# Patient Record
Sex: Male | Born: 1995 | Race: Black or African American | Hispanic: No
Health system: Southern US, Community
[De-identification: ages and names within clinical notes are randomized; demographics above are authoritative.]

---
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
## 2019-05-19 ENCOUNTER — Emergency Department (HOSPITAL_COMMUNITY)
Admission: EM | Admit: 2019-05-19 | Discharge: 2019-05-19 | Disposition: A | Discharge status: Home / Self Care | Payer: No Typology Code available for payment source | Attending: Emergency Medicine | Admitting: Emergency Medicine

## 2019-05-19 ENCOUNTER — Emergency Department (HOSPITAL_COMMUNITY): Payer: No Typology Code available for payment source

## 2019-05-19 ENCOUNTER — Other Ambulatory Visit: Payer: Self-pay

## 2019-05-19 ENCOUNTER — Encounter (HOSPITAL_COMMUNITY): Payer: Self-pay | Admitting: Emergency Medicine

## 2019-05-19 DIAGNOSIS — Z113 Encounter for screening for infections with a predominantly sexual mode of transmission: Secondary | ICD-10-CM

## 2019-05-19 DIAGNOSIS — R1032 Left lower quadrant pain: Secondary | ICD-10-CM | POA: Insufficient documentation

## 2019-05-19 DIAGNOSIS — Z202 Contact with and (suspected) exposure to infections with a predominantly sexual mode of transmission: Secondary | ICD-10-CM | POA: Insufficient documentation

## 2019-05-19 LAB — URINALYSIS, ROUTINE W REFLEX MICROSCOPIC
Bilirubin Urine: NEGATIVE
Glucose, UA: NEGATIVE mg/dL
Hgb urine dipstick: NEGATIVE
Ketones, ur: NEGATIVE mg/dL
Leukocytes,Ua: NEGATIVE
Nitrite: NEGATIVE
Protein, ur: NEGATIVE mg/dL
Specific Gravity, Urine: 1.023 (ref 1.005–1.030)
pH: 6 (ref 5.0–8.0)

## 2019-05-19 MED ORDER — METHOCARBAMOL 500 MG PO TABS: 500.0000 mg | ORAL_TABLET | Freq: Two times a day (BID) | ORAL | 0 refills | Status: AC

## 2019-05-19 MED ORDER — ALUM & MAG HYDROXIDE-SIMETH 200-200-20 MG/5ML PO SUSP
30.0000 mL | Freq: Once | ORAL | Status: AC
  Administered 2019-05-19: 30 mL via ORAL
  Filled 2019-05-19: qty 30

## 2019-05-19 MED ORDER — DICLOFENAC SODIUM 1 % TD GEL: 4.0000 g | Freq: Four times a day (QID) | TRANSDERMAL | 0 refills | Status: AC

## 2019-05-19 MED ORDER — METHOCARBAMOL 500 MG PO TABS
1000.0000 mg | ORAL_TABLET | ORAL | Status: AC
  Administered 2019-05-19: 1000 mg via ORAL
  Filled 2019-05-19: qty 2

## 2019-05-19 MED ORDER — ACETAMINOPHEN 500 MG PO TABS
1000.0000 mg | ORAL_TABLET | Freq: Once | ORAL | Status: AC
  Administered 2019-05-19: 1000 mg via ORAL
  Filled 2019-05-19: qty 2

## 2019-05-19 NOTE — ED Provider Notes (Signed)
[facility] EMERGENCY DEPARTMENT Provider Note   CSN: [id number] Arrival date & time: 05/19/19  0419    History   Chief Complaint Chief Complaint  Patient presents with  . Groin Pain    HPI Carl Weeks is a 23 y.o. male.     The history is provided by the patient.  Groin Pain  This is a chronic problem. The current episode started more than 1 week ago. The problem occurs constantly. The problem has not changed since onset.Pertinent negatives include no chest pain, no abdominal pain, no headaches and no shortness of breath. Nothing aggravates the symptoms. Nothing relieves the symptoms. He has tried nothing for the symptoms. The treatment provided no relief.  Slipped in the shower on the 11th, and has a groin strain.  Still not better seen urgent care x 2.  Also wants STD testing.    History reviewed. No pertinent past medical history.  There are no active problems to display for this patient.   History reviewed. No pertinent surgical history.      Home Medications    Prior to Admission medications   Not on File    Family History No family history on file.  Social History Social History   Tobacco Use  . Smoking status: Never Smoker  . Smokeless tobacco: Never Used  Substance Use Topics  . Alcohol use: Not Currently  . Drug use: Yes    Types: Marijuana     Allergies   Patient has no allergy information on record.   Review of Systems Review of Systems  Respiratory: Negative for shortness of breath.   Cardiovascular: Negative for chest pain.  Gastrointestinal: Negative for abdominal pain.  Neurological: Negative for headaches.     Physical Exam Updated Vital Signs BP (!) 143/109   Pulse 87   Temp 97.8 F (36.6 C) (Oral)   Resp 18   Wt 76.2 kg   SpO2 99%   Physical Exam Vitals signs and nursing note reviewed.  Constitutional:      Appearance: He is normal weight.  HENT:     Head: Normocephalic and atraumatic.   Nose: Nose normal.  Eyes:     Conjunctiva/sclera: Conjunctivae normal.     Pupils: Pupils are equal, round, and reactive to light.  Neck:     Musculoskeletal: Normal range of motion and neck supple.  Cardiovascular:     Rate and Rhythm: Normal rate and regular rhythm.     Pulses: Normal pulses.     Heart sounds: Normal heart sounds.  Pulmonary:     Effort: Pulmonary effort is normal.     Breath sounds: Normal breath sounds.  Abdominal:     General: Abdomen is flat. Bowel sounds are normal.     Tenderness: There is no abdominal tenderness. There is no guarding or rebound.     Hernia: No hernia is present.  Musculoskeletal: Normal range of motion.        General: No tenderness or deformity.     Left hip: Normal.     Left knee: Normal.     Right lower leg: No edema.     Left lower leg: No edema.  Skin:    General: Skin is warm and dry.  Neurological:     General: No focal deficit present.     Mental Status: He is alert and oriented to person, place, and time.  Psychiatric:        Mood and Affect: Mood normal.  ED Treatments / Results  Labs (all labs ordered are listed, but only abnormal results are displayed) Labs Reviewed  URINALYSIS, ROUTINE W REFLEX MICROSCOPIC - Abnormal; Notable for the following components:      Result Value   APPearance HAZY (*)    All other components within normal limits    EKG None  Radiology Dg Hips Bilat W Or Wo Pelvis 3-4 Views  Result Date: 05/19/2019 CLINICAL DATA:  Bilateral hip pain EXAM: DG HIP (WITH OR WITHOUT PELVIS) 3-4V BILAT COMPARISON:  None. FINDINGS: No fracture or dislocation is seen. Bilateral hip joint spaces are preserved. Visualized bony pelvis appears intact. IMPRESSION: Negative. Electronically Signed   By: Charline BillsSriyesh  Krishnan M.D.   On: 05/19/2019 06:45    Procedures Procedures (including critical care time)  Medications Ordered in ED Medications  alum & mag hydroxide-simeth (MAALOX/MYLANTA) 200-200-20 MG/5ML  suspension 30 mL (30 mLs Oral Given 05/19/19 0646)  acetaminophen (TYLENOL) tablet 1,000 mg (1,000 mg Oral Given 05/19/19 0647)  methocarbamol (ROBAXIN) tablet 1,000 mg (1,000 mg Oral Given 05/19/19 0646)    Requested GC testing added to the urine,  Will treat with robaxin and voltaren gel    Final Clinical Impressions(s) / ED Diagnoses   Return for intractable cough, coughing up blood,fevers >100.4 unrelieved by medication, shortness of breath, intractable vomiting, chest pain, shortness of breath, weakness,numbness, changes in speech, facial asymmetry,abdominal pain, passing out,Inability to tolerate liquids or food, cough, altered mental status or any concerns. No signs of systemic illness or infection. The patient is nontoxic-appearing on exam and vital signs are within normal limits.   I have reviewed the triage vital signs and the nursing notes. Pertinent labs &imaging results that were available during my care of the patient were reviewed by me and considered in my medical decision making (see chart for details).  After history, exam, and medical workup I feel the patient has been appropriately medically screened and is safe for discharge home. Pertinent diagnoses were discussed with the patient. Patient was given return precautions   Jasemine Nawaz, MD 05/19/19 [id number]

## 2019-05-19 NOTE — ED Triage Notes (Signed)
Pt in with c/o L groin pain since straining a tendon while getting out of the shower 5/11. States he has limited leg lifting ability, is able to bear weight. Has been taking Naproxen, but now has stomach pain. Also wants STD check

## 2019-05-20 LAB — GC/CHLAMYDIA PROBE AMP (~~LOC~~) NOT AT ARMC
Chlamydia: NEGATIVE
Neisseria Gonorrhea: NEGATIVE

## 2019-05-21 ENCOUNTER — Telehealth (HOSPITAL_COMMUNITY): Payer: Self-pay

## 2019-06-07 ENCOUNTER — Emergency Department (HOSPITAL_COMMUNITY)
Admission: EM | Admit: 2019-06-07 | Discharge: 2019-06-07 | Disposition: A | Discharge status: Home / Self Care | Payer: No Typology Code available for payment source | Attending: Emergency Medicine | Admitting: Emergency Medicine

## 2019-06-07 ENCOUNTER — Other Ambulatory Visit: Payer: Self-pay

## 2019-06-07 ENCOUNTER — Encounter (HOSPITAL_COMMUNITY): Payer: Self-pay | Admitting: Emergency Medicine

## 2019-06-07 DIAGNOSIS — R1032 Left lower quadrant pain: Secondary | ICD-10-CM | POA: Insufficient documentation

## 2019-06-07 MED ORDER — LACTATED RINGERS IV BOLUS: 1000.0000 mL | Freq: Once | INTRAVENOUS | Status: DC

## 2019-06-07 MED ORDER — DIPHENHYDRAMINE HCL 50 MG/ML IJ SOLN: 25.0000 mg | Freq: Once | INTRAMUSCULAR | Status: DC

## 2019-06-07 NOTE — ED Provider Notes (Signed)
[place] EMERGENCY DEPARTMENT Provider Note   CSN: [id number] Arrival date & time: 06/07/19  0050     History   Chief Complaint Chief Complaint  Patient presents with   Groin Pain    HPI Carl Weeks is a 23 y.o. male.      Groin Pain This is a new problem. The current episode started more than 1 week ago. The problem occurs constantly. The problem has been gradually improving. Pertinent negatives include no chest pain, no abdominal pain, no headaches and no shortness of breath. Nothing aggravates the symptoms. Relieved by: improving with time. He has tried nothing for the symptoms.    History reviewed. No pertinent past medical history.  There are no active problems to display for this patient.   No past surgical history on file.      Home Medications    Prior to Admission medications   Medication Sig Start Date End Date Taking? Authorizing Provider  diclofenac sodium (VOLTAREN) 1 % GEL Apply 4 g topically 4 (four) times daily. 05/19/19   Palumbo, April, MD  methocarbamol (ROBAXIN) 500 MG tablet Take 1 tablet (500 mg total) by mouth 2 (two) times daily. 05/19/19   Palumbo, April, MD    Family History No family history on file.  Social History Social History   Tobacco Use   Smoking status: Never Smoker   Smokeless tobacco: Never Used  Substance Use Topics   Alcohol use: Not Currently   Drug use: Yes    Types: Marijuana     Allergies   Patient has no allergy information on record.   Review of Systems Review of Systems  Respiratory: Negative for shortness of breath.   Cardiovascular: Negative for chest pain.  Gastrointestinal: Negative for abdominal pain.  Neurological: Negative for headaches.  All other systems reviewed and are negative.    Physical Exam Updated Vital Signs BP (!) 108/52 (BP Location: Left Arm)    Pulse 68    Temp 98.5 F (36.9 C) (Oral)    Resp 16    Ht 6' (1.829 m)    Wt 76.2 kg    SpO2 99%    BMI  22.78 kg/m   Physical Exam Vitals signs and nursing note reviewed.  Constitutional:      Appearance: He is well-developed.  HENT:     Head: Normocephalic and atraumatic.     Nose: No congestion or rhinorrhea.     Mouth/Throat:     Mouth: Mucous membranes are moist.     Pharynx: Oropharynx is clear.  Eyes:     Pupils: Pupils are equal, round, and reactive to light.  Neck:     Musculoskeletal: Normal range of motion.  Cardiovascular:     Rate and Rhythm: Normal rate.  Pulmonary:     Effort: Pulmonary effort is normal. No respiratory distress.  Abdominal:     General: There is no distension.  Genitourinary:    Penis: Normal.      Scrotum/Testes: Normal.     Rectum: Normal.  Musculoskeletal: Normal range of motion.        General: Tenderness (left inguinal area with direct palpation and ROM) present.  Skin:    General: Skin is warm and dry.  Neurological:     General: No focal deficit present.     Mental Status: He is alert.      ED Treatments / Results  Labs (all labs ordered are listed, but only abnormal results are displayed) Labs Reviewed -  No data to display  EKG    Radiology No results found.  Procedures Procedures (including critical care time)  Medications Ordered in ED Medications - No data to display   Initial Impression / Assessment and Plan / ED Course  I have reviewed the triage vital signs and the nursing notes.  Pertinent labs & imaging results that were available during my care of the patient were reviewed by me and considered in my medical decision making (see chart for details).        Improving left inguinal sprain. No indication for further workup at this time. Ortho info given.   Final Clinical Impressions(s) / ED Diagnoses   Final diagnoses:  Left groin pain    ED Discharge Orders    None       Maybelle Depaoli, Barbara CowerJason, MD 06/07/19 [phone]

## 2019-06-07 NOTE — ED Triage Notes (Signed)
C/o left groin pain that started a  Month ago.  Reports he was seen when it happened and everything was ruled out.  States I want to make sure its okay because I shouldn't still be hurting.  Rating pain at 2/10.

## 2019-06-18 ENCOUNTER — Other Ambulatory Visit: Payer: Self-pay

## 2019-06-18 ENCOUNTER — Ambulatory Visit (INDEPENDENT_AMBULATORY_CARE_PROVIDER_SITE_OTHER): Payer: No Typology Code available for payment source | Admitting: Orthopaedic Surgery

## 2019-06-18 DIAGNOSIS — S76012A Strain of muscle, fascia and tendon of left hip, initial encounter: Secondary | ICD-10-CM

## 2019-06-18 NOTE — Progress Notes (Signed)
Office Visit Note   Patient: Carl Weeks           Date of Birth: 1996-06-26           MRN: [id number] Visit Date: 06/18/2019              Requested by: No referring provider defined for this encounter. PCP: Patient, No Pcp Per   Assessment & Plan: Visit Diagnoses:  1. Strain of left hip adductor muscle, initial encounter     Plan: Impression is strain of left hip adductor that has been improving.  I recommend outpatient physical therapy for strengthening and stretching.  Continue with symptomatic treatment such as rest and heat and NSAIDs.  Questions encouraged and answered.  Follow-up as needed.  Follow-Up Instructions: Return if symptoms worsen or fail to improve.   Orders:  No orders of the defined types were placed in this encounter.  No orders of the defined types were placed in this encounter.     Procedures: No procedures performed   Clinical Data: No additional findings.   Subjective: Chief Complaint  Patient presents with  . Left Leg - Pain    Carl Weeks is a 23 year old gentleman who comes in for continued left groin pain since he slipped in the shower in May.  He has since been evaluated multiple times in the ER for this and ruled out for STDs or testicular torsion.  He states overall his hip function is improved and that he is able to flex and extend his knee and hip.  He was originally unable to bear weight.  He has not noticed any bruising.  He feels pain overlying the abductor muscle area.  Denies any lateral hip pain.  Denies any numbness and tingling.   Review of Systems  Constitutional: Negative.   All other systems reviewed and are negative.    Objective: Vital Signs: There were no vitals taken for this visit.  Physical Exam Vitals signs and nursing note reviewed.  Constitutional:      Appearance: He is well-developed.  HENT:     Head: Normocephalic and atraumatic.  Eyes:     Pupils: Pupils are equal, round, and reactive to light.  Neck:      Musculoskeletal: Neck supple.  Pulmonary:     Effort: Pulmonary effort is normal.  Abdominal:     Palpations: Abdomen is soft.  Musculoskeletal: Normal range of motion.  Skin:    General: Skin is warm.  Neurological:     Mental Status: He is alert and oriented to person, place, and time.  Psychiatric:        Behavior: Behavior normal.        Thought Content: Thought content normal.        Judgment: Judgment normal.     Ortho Exam Left hip exam shows tenderness of the adductor insertion near the groin region.  He has pain with resisted hip adduction.  Otherwise hip exam is normal. Specialty Comments:  No specialty comments available.  Imaging: No results found.   PMFS History: There are no active problems to display for this patient.  No past medical history on file.  No family history on file.  No past surgical history on file. Social History   Occupational History  . Not on file  Tobacco Use  . Smoking status: Never Smoker  . Smokeless tobacco: Never Used  Substance and Sexual Activity  . Alcohol use: Not Currently  . Drug use: Yes    Types: Marijuana  .  Sexual activity: Not on file

## 2019-06-19 ENCOUNTER — Telehealth: Payer: Self-pay | Admitting: Orthopaedic Surgery

## 2019-06-19 DIAGNOSIS — S76012A Strain of muscle, fascia and tendon of left hip, initial encounter: Secondary | ICD-10-CM

## 2019-06-19 NOTE — Telephone Encounter (Signed)
Sure - that's fine

## 2019-06-19 NOTE — Telephone Encounter (Signed)
Please advise 

## 2019-06-19 NOTE — Telephone Encounter (Signed)
Pt called in requesting to have an mri done said he was told he tore his left groin and he wants to see that for himself in a mri imagine to confirm it.   [phone]

## 2019-06-19 NOTE — Telephone Encounter (Signed)
I had called patient to sched an appt, did not realize he already saw you.  Referral showed up in [place] today.   Patient is still asking for an MRI scan to confirm the groin strain.  Do you think any other imaging would show this?  He says this has been going on for 2 months, and he really wants the MRI done.   Please call him to discuss/advise.

## 2019-06-20 ENCOUNTER — Encounter: Payer: Self-pay | Admitting: Orthopaedic Surgery

## 2019-06-20 NOTE — Addendum Note (Signed)
Addended by: Brand Males E on: 06/20/2019 08:00 AM   Modules accepted: Orders

## 2019-06-20 NOTE — Progress Notes (Unsigned)
Patient is going for MRI scan 06/21/19 845pm, at [place].  Can you please review scan on [place] and call patient with the results?  Thanks.  [phone]

## 2019-06-20 NOTE — Telephone Encounter (Signed)
Order entered, will try to get scheduled ASAP for patient.

## 2019-06-20 NOTE — Progress Notes (Signed)
yes

## 2019-06-25 ENCOUNTER — Ambulatory Visit: Payer: No Typology Code available for payment source | Admitting: Orthopaedic Surgery

## 2019-06-25 NOTE — Telephone Encounter (Signed)
Please see below and advise.

## 2019-06-25 NOTE — Progress Notes (Signed)
Why is he not able to come in to see me?

## 2019-06-25 NOTE — Progress Notes (Signed)
Reminder to review and call patient with results, thanks.

## 2019-06-25 NOTE — Telephone Encounter (Signed)
MRI is completely normal.  If he has any additional questions, he's welcome to make an appointment.  Thanks.

## 2019-06-25 NOTE — Progress Notes (Signed)
He had just asked for a phone call.  I can certainly tell him you would rather see him to review so you can show him the images, etc.  Just let me know.

## 2019-06-25 NOTE — Progress Notes (Signed)
Spoke with him

## 2019-06-25 NOTE — Telephone Encounter (Signed)
Patient called in stating that he got the call his MRI results were back. I tried to schedule appt for MRI follow up patient doesn't want to come in for that he wants the results over the phone  please advise   [phone]

## 2019-06-26 NOTE — Telephone Encounter (Signed)
I called pt and advised of message below. Advised to call with any questions.

## 2020-08-11 ENCOUNTER — Ambulatory Visit: Payer: Self-pay

## 2021-04-23 IMAGING — CR DG HIP (WITH OR WITHOUT PELVIS) 3-4V BILAT
5 series · 5 of 5 positions shown · non-contrast
Comparison: None.

CLINICAL DATA: Bilateral hip pain

EXAM:
DG HIP (WITH OR WITHOUT PELVIS) 3-4V BILAT

[pelvis ap]
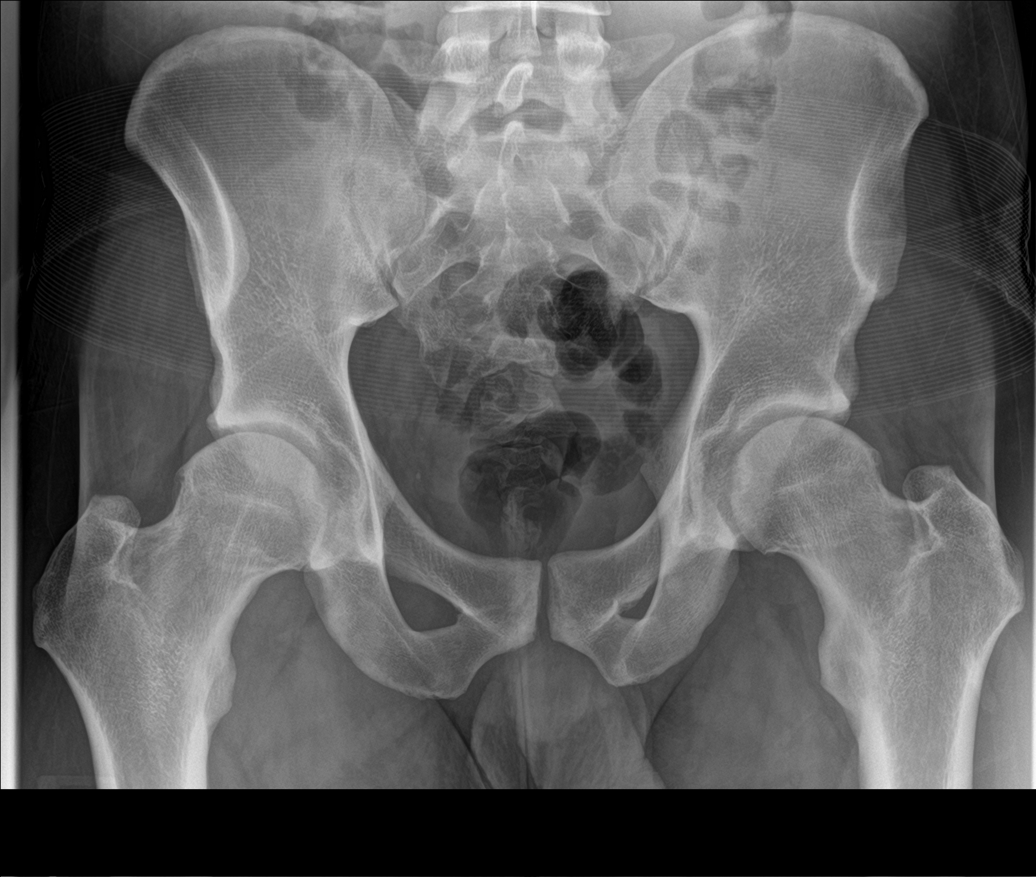

[hip ap (1 of 2)]
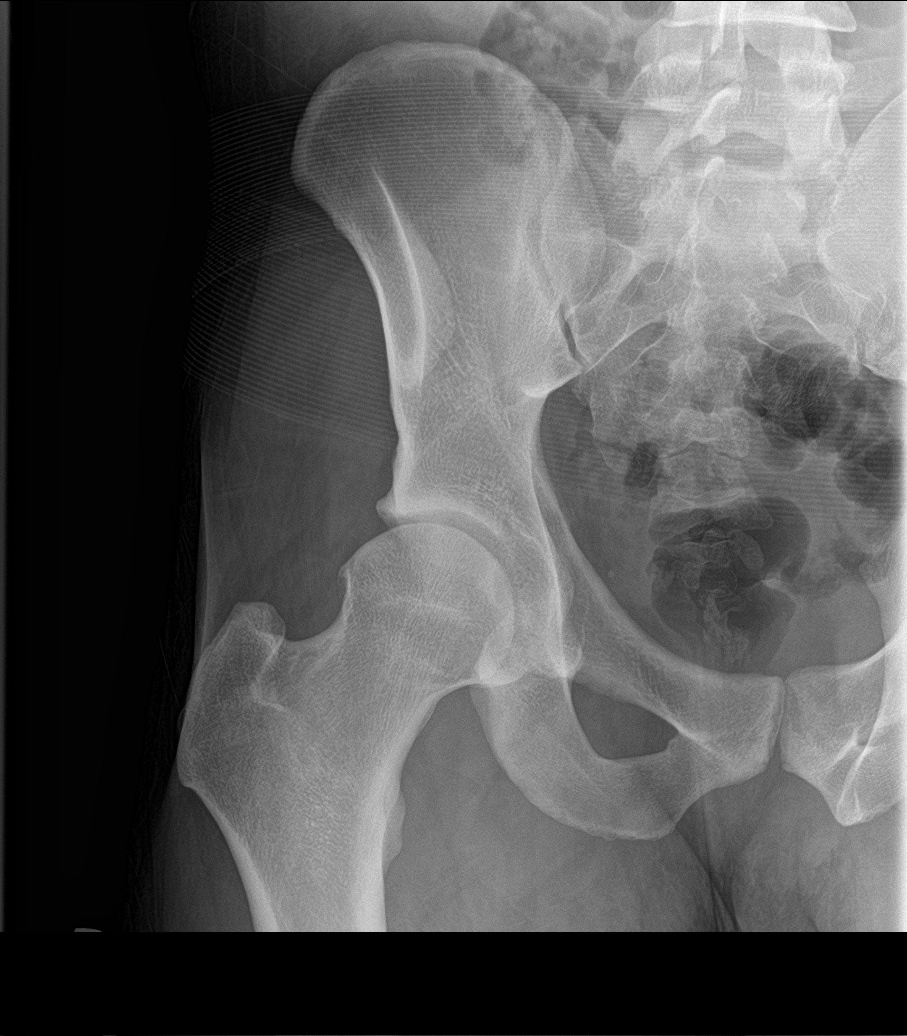

[hip lat (1 of 2)]
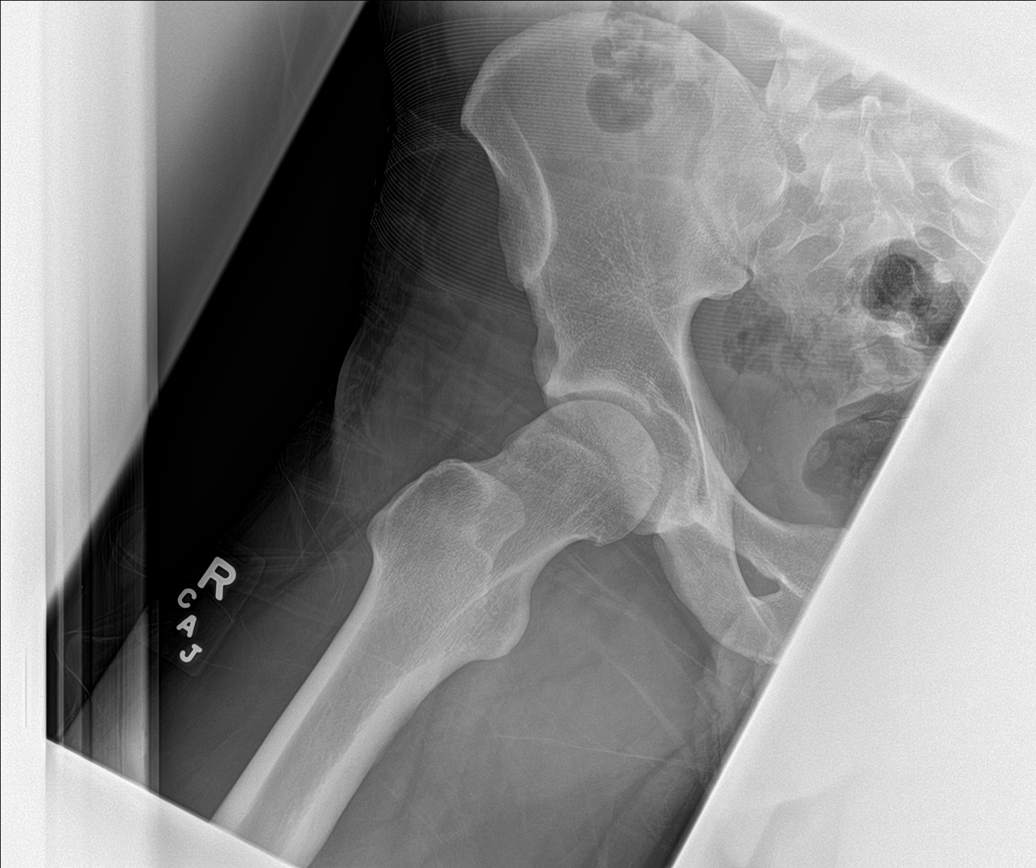

[hip ap (2 of 2)]
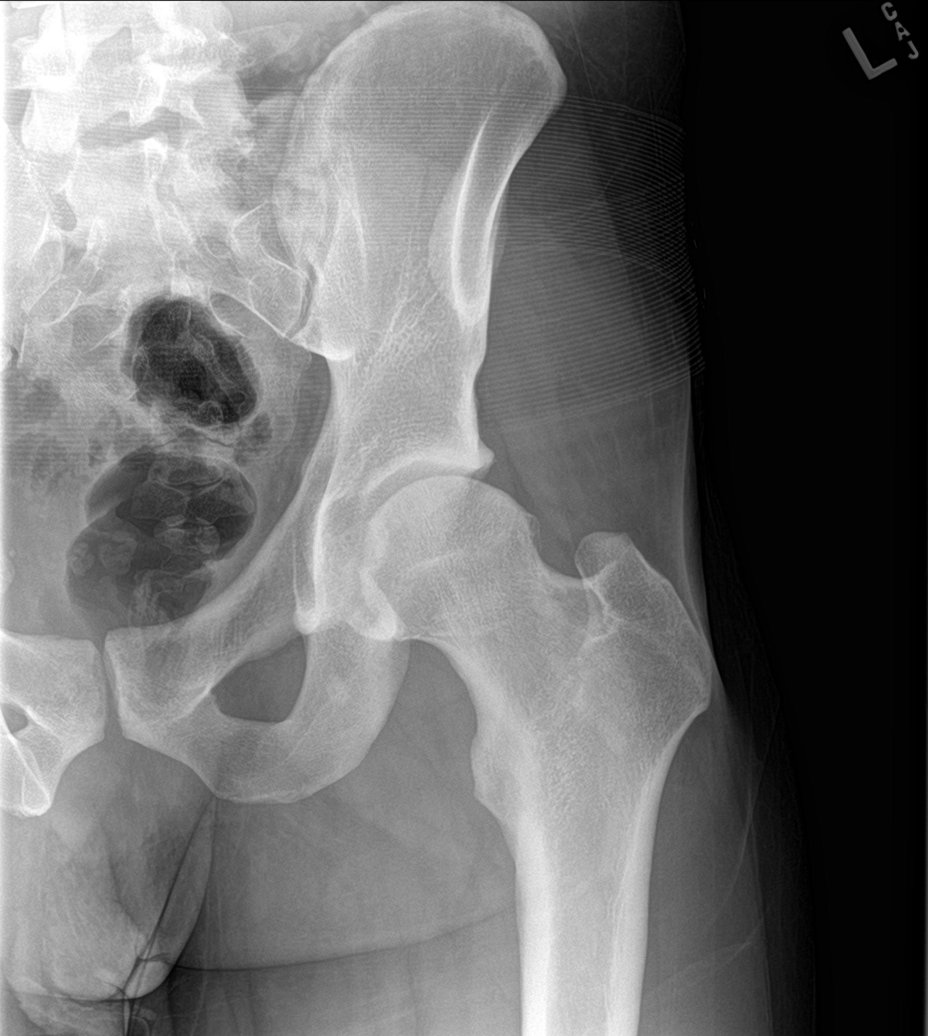

[hip lat (2 of 2)]
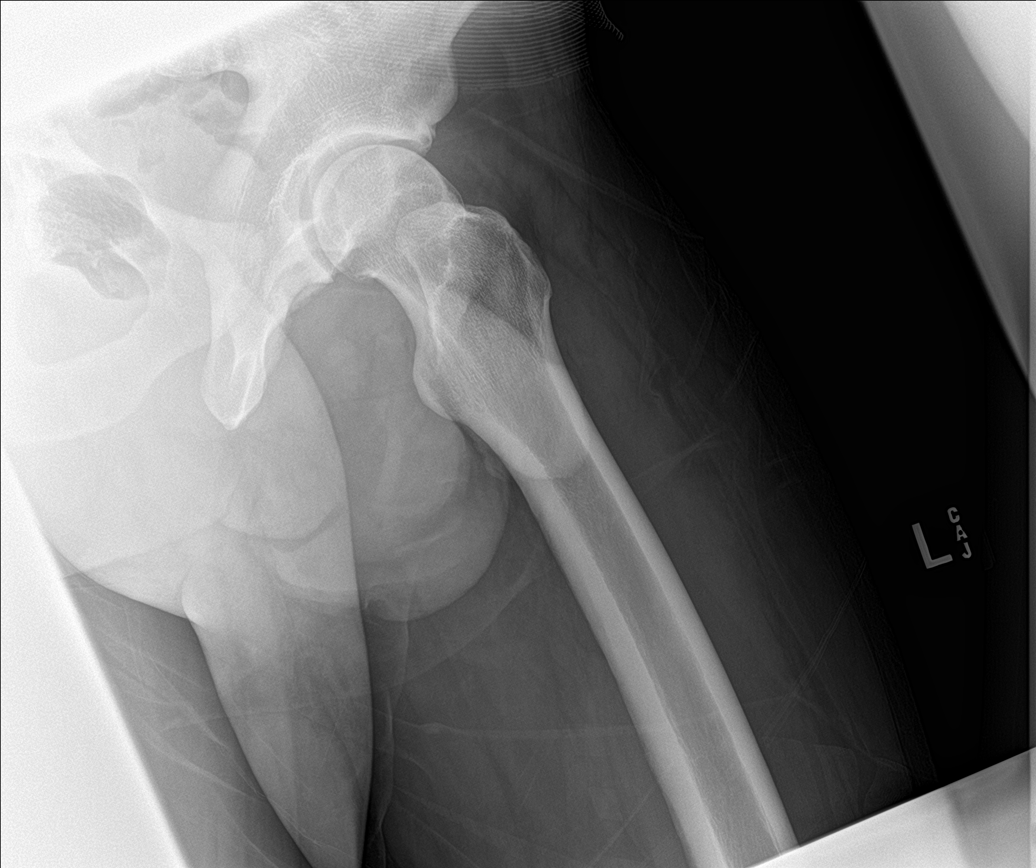

[5 of 5 positions shown; findings below may reference images not displayed]

FINDINGS: No fracture or dislocation is seen.

Bilateral hip joint spaces are preserved.

Visualized bony pelvis appears intact.
IMPRESSION: Negative.
